# Patient Record
Sex: Female | Born: 1945 | Race: White | Hispanic: No | Smoking: Never smoker
Health system: Southern US, Community
[De-identification: ages and names within clinical notes are randomized; demographics above are authoritative.]

## PROBLEM LIST (undated history)

## (undated) DIAGNOSIS — I10 Essential (primary) hypertension: Secondary | ICD-10-CM

## (undated) DIAGNOSIS — C801 Malignant (primary) neoplasm, unspecified: Secondary | ICD-10-CM

## (undated) DIAGNOSIS — Z87448 Personal history of other diseases of urinary system: Secondary | ICD-10-CM

## (undated) HISTORY — PX: MASTECTOMY: SHX3

---

## 2017-03-17 ENCOUNTER — Emergency Department (HOSPITAL_BASED_OUTPATIENT_CLINIC_OR_DEPARTMENT_OTHER): Payer: Medicare Other

## 2017-03-17 ENCOUNTER — Encounter (HOSPITAL_BASED_OUTPATIENT_CLINIC_OR_DEPARTMENT_OTHER): Payer: Self-pay | Admitting: Emergency Medicine

## 2017-03-17 ENCOUNTER — Emergency Department (HOSPITAL_BASED_OUTPATIENT_CLINIC_OR_DEPARTMENT_OTHER)
Admission: EM | Admit: 2017-03-17 | Discharge: 2017-03-17 | Disposition: A | Discharge status: Home / Self Care | Payer: Medicare Other | Attending: Emergency Medicine | Admitting: Emergency Medicine

## 2017-03-17 DIAGNOSIS — M7989 Other specified soft tissue disorders: Secondary | ICD-10-CM

## 2017-03-17 DIAGNOSIS — Z853 Personal history of malignant neoplasm of breast: Secondary | ICD-10-CM | POA: Insufficient documentation

## 2017-03-17 DIAGNOSIS — R600 Localized edema: Secondary | ICD-10-CM | POA: Insufficient documentation

## 2017-03-17 DIAGNOSIS — I10 Essential (primary) hypertension: Secondary | ICD-10-CM | POA: Diagnosis not present

## 2017-03-17 DIAGNOSIS — Z79899 Other long term (current) drug therapy: Secondary | ICD-10-CM | POA: Diagnosis not present

## 2017-03-17 HISTORY — DX: Personal history of other diseases of urinary system: Z87.448

## 2017-03-17 HISTORY — DX: Essential (primary) hypertension: I10

## 2017-03-17 HISTORY — DX: Malignant (primary) neoplasm, unspecified: C80.1

## 2017-03-17 NOTE — ED Provider Notes (Signed)
[facility] DEPT [facility] Provider Note   CSN: [id number] Arrival date & time: 03/17/17  1908  By signing my name below, I, Jeanell Sparrow, attest that this documentation has been prepared under the direction and in the presence of Dorie Rank, MD. Electronically Signed: Jeanell Sparrow, Scribe. 03/17/2017. 9:20 PM.  History   Chief Complaint Chief Complaint  Patient presents with  . Hand Problem   The history is provided by the patient. No language interpreter was used.   HPI Comments: Vanessa Castro is a 71 y.o. female with a PMHx of cancer who presents to the Emergency Department complaining of constant moderate left hand swelling that started a few days ago. She had a mastectomy in January 2018. She suspects a possible spider bite but is uncertain of any initiating factors. No known injury. No modifying factors. Denies any left hand pain or other complaints at this time.    PCP: Reita Cliche, MD  Past Medical History:  Diagnosis Date  . Cancer ([place])   . H/O bladder problems   . Hypertension     There are no active problems to display for this patient.   Past Surgical History:  Procedure Laterality Date  . MASTECTOMY      left    OB History    No data available       Home Medications    Prior to Admission medications   Medication Sig Start Date End Date Taking? Authorizing Provider  lisinopril (PRINIVIL,ZESTRIL) 2.5 MG tablet Take 2.5 mg by mouth daily.   Yes Historical Provider, MD    Family History No family history on file.  Social History Social History  Substance Use Topics  . Smoking status: Not on file  . Smokeless tobacco: Not on file  . Alcohol use Not on file     Allergies   Patient has no known allergies.   Review of Systems Review of Systems  Constitutional: Negative for fever.  Respiratory: Negative for shortness of breath.   Musculoskeletal: Positive for joint swelling (Left hand). Negative for myalgias.     Physical  Exam Updated Vital Signs BP (!) 189/98 (BP Location: Right Arm)   Pulse 79   Temp 98.1 F (36.7 C) (Oral)   Resp 18   Wt 135 lb (61.2 kg)   SpO2 98%   Physical Exam  Constitutional: She appears well-developed and well-nourished. No distress.  HENT:  Head: Normocephalic and atraumatic.  Right Ear: External ear normal.  Left Ear: External ear normal.  Eyes: Conjunctivae are normal. Right eye exhibits no discharge. Left eye exhibits no discharge. No scleral icterus.  Neck: Neck supple. No tracheal deviation present.  Cardiovascular: Normal rate.   Pulmonary/Chest: Effort normal. No stridor. No respiratory distress.  Status post left mastectomy with temporary spacer in place. Mild edema noted to the breast tissue. No erythema   Abdominal: She exhibits no distension.  Musculoskeletal: She exhibits edema. She exhibits no tenderness.  Mild edema of the left hand, no appreciable edema of the left forearm or upper. No tenderness or joint swelling.   Neurological: She is alert. Cranial nerve deficit: no gross deficits.  Skin: Skin is warm and dry. No rash noted.  Psychiatric: She has a normal mood and affect.  Nursing note and vitals reviewed.    ED Treatments / Results  DIAGNOSTIC STUDIES: Oxygen Saturation is 98% on RA, normal by my interpretation.    COORDINATION OF CARE: 9:24 PM- Pt advised of plan for treatment and pt agrees.  Labs (all  labs ordered are listed, but only abnormal results are displayed) Labs Reviewed - No data to display  EKG  EKG Interpretation None       Radiology Dg Hand Complete Left  Result Date: 03/17/2017 CLINICAL DATA:  Left hand swelling, no known injury, initial encounter EXAM: LEFT HAND - COMPLETE 3+ VIEW COMPARISON:  None. FINDINGS: There is no evidence of fracture or dislocation. No significant soft tissue abnormality is seen. Degenerative changes of the first [facility] joint are noted. IMPRESSION: No acute abnormality noted. Electronically Signed    By: Inez Catalina M.D.   On: 03/17/2017 20:34    Procedures Procedures (including critical care time)  Medications Ordered in ED Medications - No data to display   Initial Impression / Assessment and Plan / ED Course  I have reviewed the triage vital signs and the nursing notes.  Pertinent labs & imaging results that were available during my care of the patient were reviewed by me and considered in my medical decision making (see chart for details).   patient has mild painless edema of her left upper extremity, primarily her hand. Patient did have a mastectomy on that side in January. I suspect she is having lymphedema as a complication of that procedure. I doubt DVT however considering this new complaint I will have her return tomorrow to have an ultrasound of her left upper extremity.  Unfortunately ultrasound is not available this evening.  Considering my lower index of suspicion, I do not think she requires anticoagulation at this time.  Final Clinical Impressions(s) / ED Diagnoses   Final diagnoses:  Left upper extremity swelling    I personally performed the services described in this documentation, which was scribed in my presence.  The recorded information has been reviewed and is accurate.     Dorie Rank, MD 03/17/17 [phone]

## 2017-03-17 NOTE — Discharge Instructions (Signed)
Follow up tomorrow to have the ultrasound of your arm done, return for chest pain, shortness of breath, fever , or other acute concerns

## 2017-03-17 NOTE — ED Notes (Signed)
Pt presents to ED with complaints of left hand swelling. No injury noted. No c/o pain. Pt sts she has a mastectomy in Jan this year with lymph node removal.

## 2017-03-18 ENCOUNTER — Ambulatory Visit (HOSPITAL_BASED_OUTPATIENT_CLINIC_OR_DEPARTMENT_OTHER)
Admission: RE | Admit: 2017-03-18 | Discharge: 2017-03-18 | Disposition: A | Discharge status: Home / Self Care | Payer: Medicare Other | Source: Ambulatory Visit | Attending: Emergency Medicine | Admitting: Emergency Medicine

## 2017-03-18 DIAGNOSIS — M7989 Other specified soft tissue disorders: Secondary | ICD-10-CM | POA: Insufficient documentation

## 2018-12-25 ENCOUNTER — Other Ambulatory Visit: Payer: Self-pay | Admitting: Obstetrics and Gynecology

## 2018-12-25 DIAGNOSIS — R928 Other abnormal and inconclusive findings on diagnostic imaging of breast: Secondary | ICD-10-CM

## 2018-12-25 DIAGNOSIS — R921 Mammographic calcification found on diagnostic imaging of breast: Secondary | ICD-10-CM

## 2018-12-31 ENCOUNTER — Ambulatory Visit
Admission: RE | Admit: 2018-12-31 | Discharge: 2018-12-31 | Disposition: A | Discharge status: Home / Self Care | Payer: Medicare Other | Source: Ambulatory Visit | Attending: Obstetrics and Gynecology | Admitting: Obstetrics and Gynecology

## 2018-12-31 DIAGNOSIS — R928 Other abnormal and inconclusive findings on diagnostic imaging of breast: Secondary | ICD-10-CM

## 2018-12-31 DIAGNOSIS — R921 Mammographic calcification found on diagnostic imaging of breast: Secondary | ICD-10-CM

## 2019-07-14 IMAGING — MG STEREOTACTIC VACUUM ASSIST RIGHT
8 of 12 series · 8 of 32 positions shown · non-contrast
Comparison: Previous exams.

Addendum:
CLINICAL DATA: Indeterminate right breast calcifications.

EXAM:
RIGHT BREAST STEREOTACTIC CORE NEEDLE BIOPSY

[R (1 of 6)]
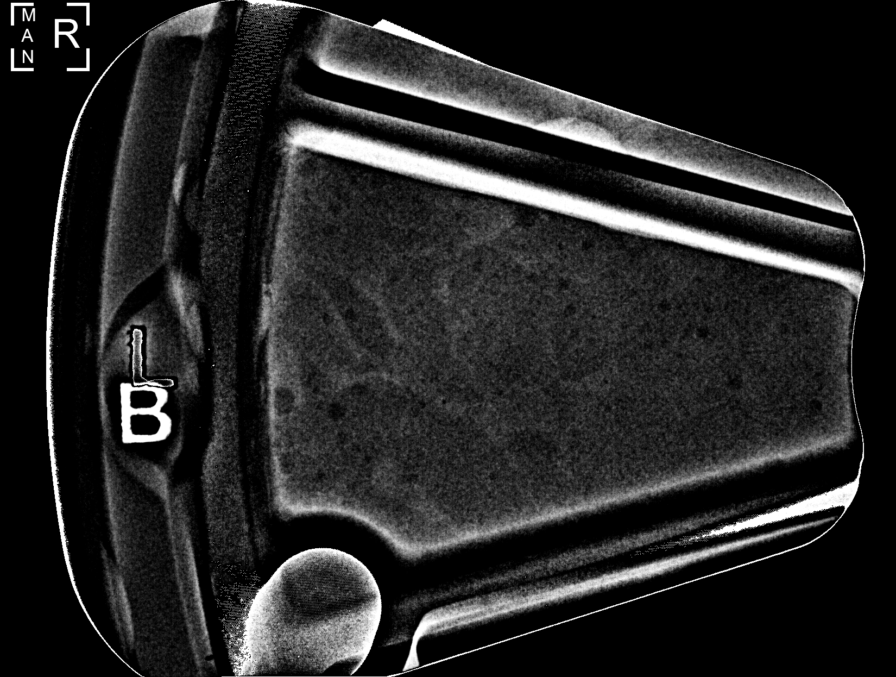

[R (2 of 6)]
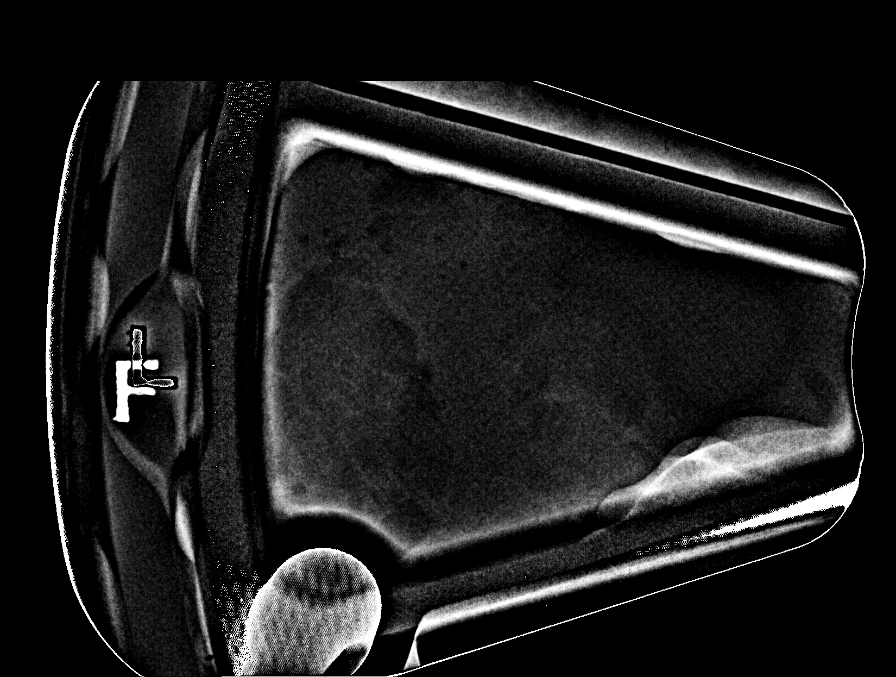

[R (3 of 6)]
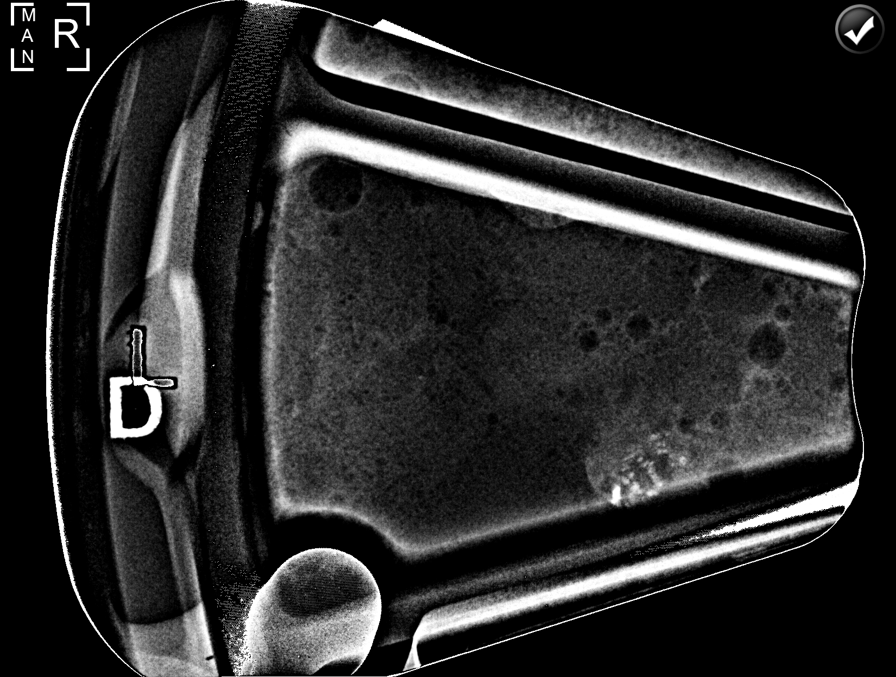

[R (4 of 6)]
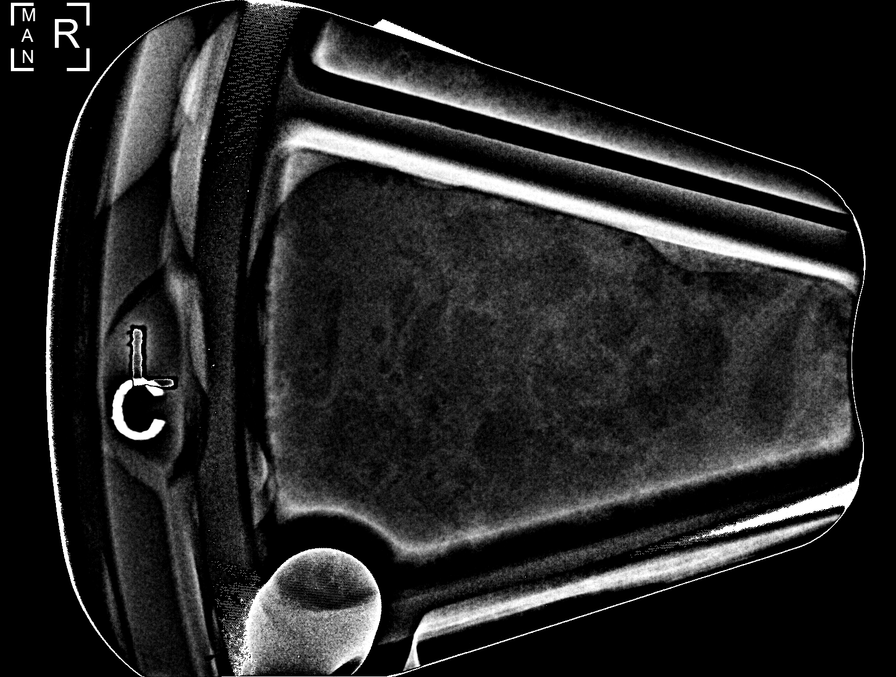

[R (5 of 6)]
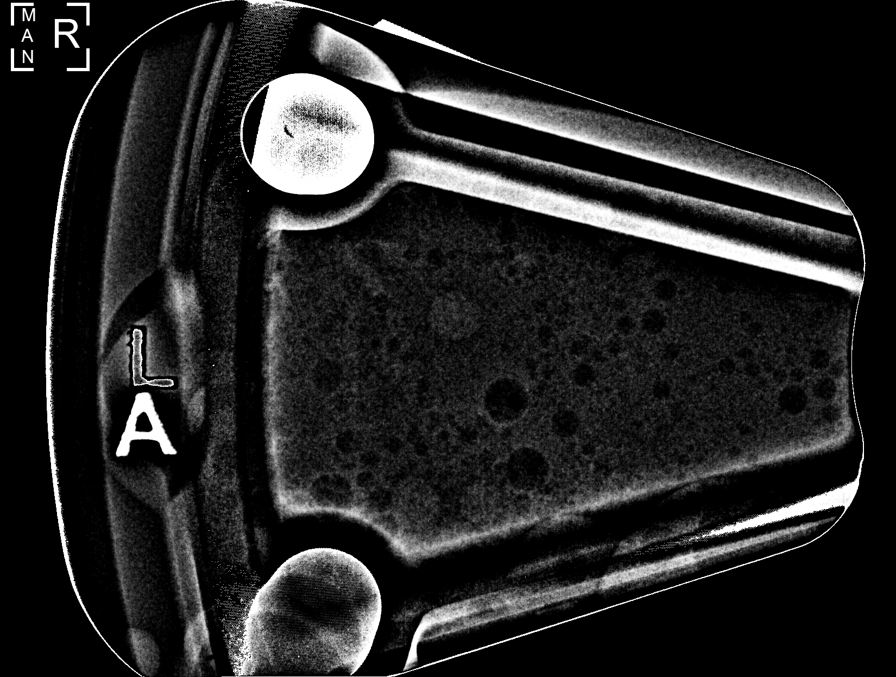

[R (6 of 6)]
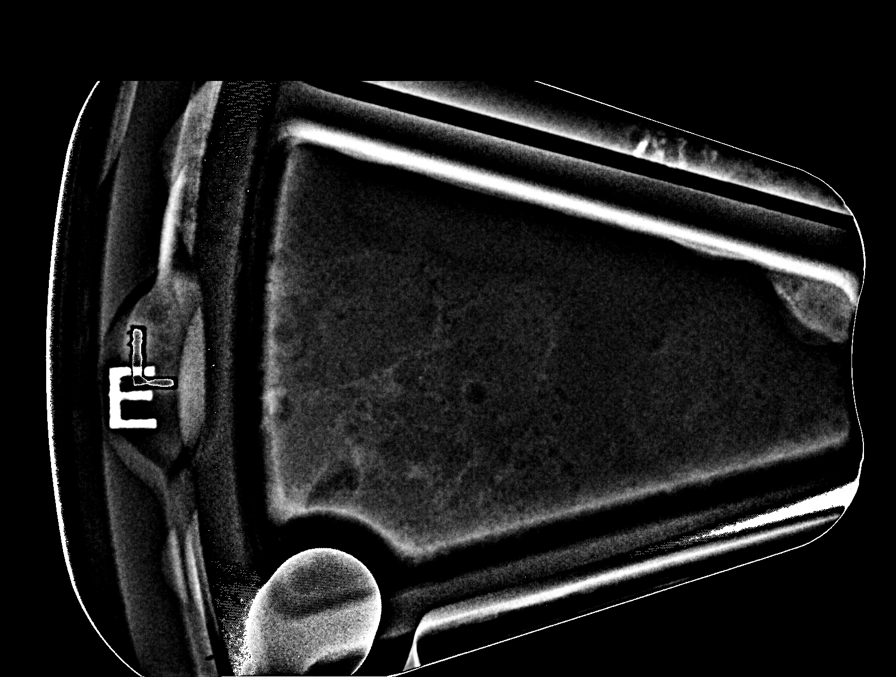

[R CC]
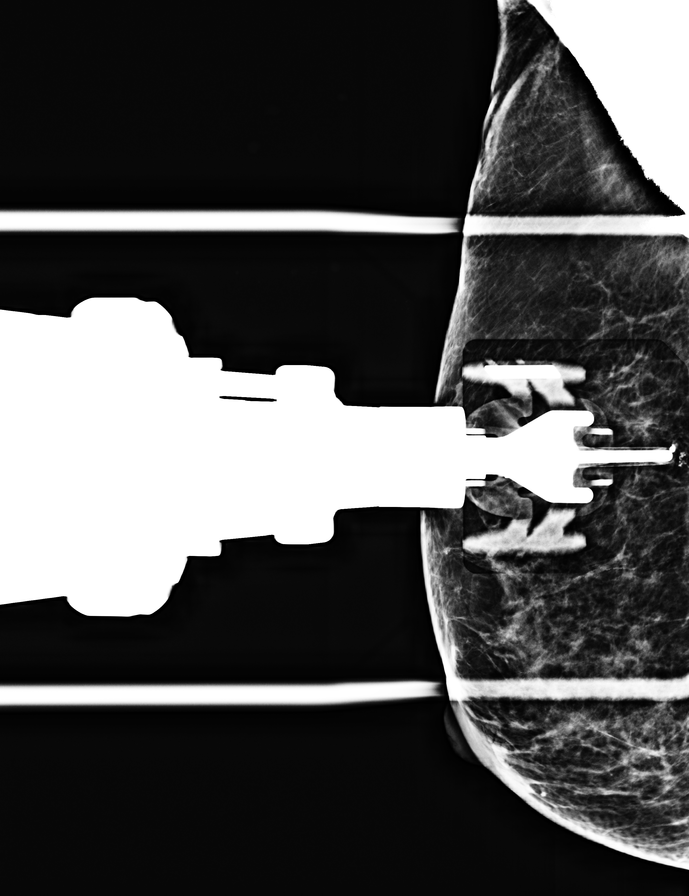

[R CC tomo · tomo slice 31/61.0]
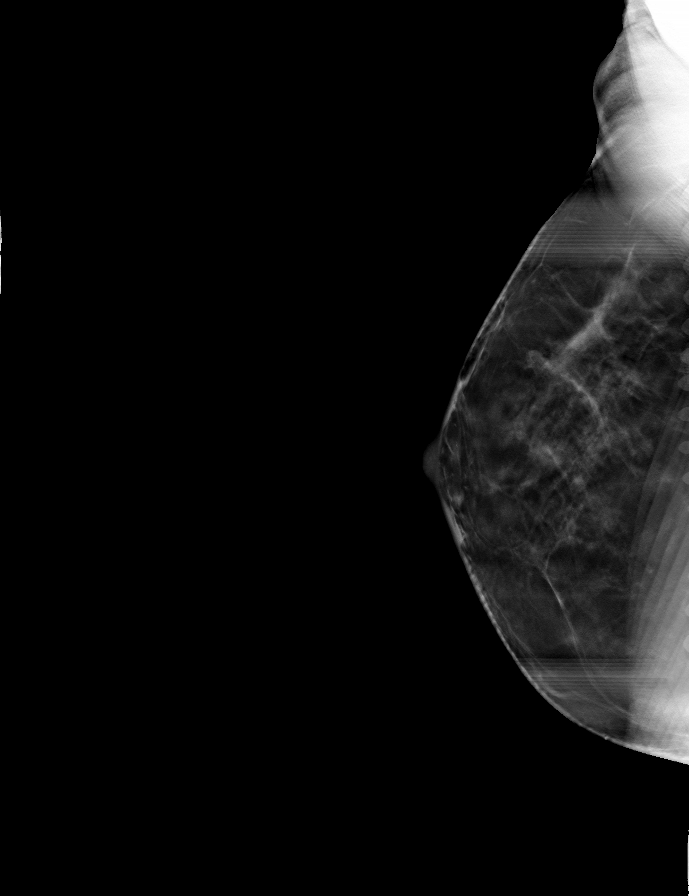

[8 of 32 positions shown; findings below may reference images not displayed]



Using sterile technique and 1% Lidocaine as local anesthetic, under
stereotactic guidance, a 9 gauge vacuum assisted device was used to
perform core needle biopsy of calcifications within the upper-outer
right breast using a cranial approach. Specimen radiograph was
performed showing calcifications. Specimens with calcifications are
identified for pathology.

Lesion quadrant: Upper outer quadrant

At the conclusion of the procedure, a coil shaped tissue marker clip
was deployed into the biopsy cavity. Follow-up 2-view mammogram was
performed and dictated separately.
IMPRESSION: Stereotactic-guided biopsy of right breast calcifications. No
apparent complications.

ADDENDUM:
Pathology revealed FAT NECROSIS WITH CALCIFICATIONS of the Right
breast, upper outer.. This was found to be concordant by Dr. Crisostomo
Ullrich.

Pathology results were to be discussed by telephone with the patient
by Iomeng Miumiu, RN at [facility] [[email or web address]]. The patient
was encouraged to call The [REDACTED] for
any additional concerns.

The patient was instructed to return for annual screening
mammography and informed a reminder notice would be sent regarding
this appointment.

Imaging and pathology reports were faxed to Iomeng Miumiu, RN at [facility] [[email or web address]] on January 01, 2019.

Pathology results reported by Hendrik Jandres, RN on 01/01/2019.

*** End of Addendum ***

## 2023-12-19 DIAGNOSIS — I10 Essential (primary) hypertension: Secondary | ICD-10-CM | POA: Diagnosis not present

## 2023-12-19 DIAGNOSIS — R748 Abnormal levels of other serum enzymes: Secondary | ICD-10-CM | POA: Diagnosis not present

## 2023-12-19 DIAGNOSIS — Z0000 Encounter for general adult medical examination without abnormal findings: Secondary | ICD-10-CM | POA: Diagnosis not present

## 2023-12-19 DIAGNOSIS — R636 Underweight: Secondary | ICD-10-CM | POA: Diagnosis not present

## 2023-12-26 DIAGNOSIS — K838 Other specified diseases of biliary tract: Secondary | ICD-10-CM | POA: Diagnosis not present

## 2023-12-26 DIAGNOSIS — K8020 Calculus of gallbladder without cholecystitis without obstruction: Secondary | ICD-10-CM | POA: Diagnosis not present

## 2023-12-26 DIAGNOSIS — R748 Abnormal levels of other serum enzymes: Secondary | ICD-10-CM | POA: Diagnosis not present

## 2023-12-26 DIAGNOSIS — R7989 Other specified abnormal findings of blood chemistry: Secondary | ICD-10-CM | POA: Diagnosis not present

## 2024-01-04 DIAGNOSIS — R0789 Other chest pain: Secondary | ICD-10-CM | POA: Diagnosis not present

## 2024-01-04 DIAGNOSIS — R0602 Shortness of breath: Secondary | ICD-10-CM | POA: Diagnosis not present

## 2024-01-04 DIAGNOSIS — Z853 Personal history of malignant neoplasm of breast: Secondary | ICD-10-CM | POA: Diagnosis not present

## 2024-01-04 DIAGNOSIS — Z79899 Other long term (current) drug therapy: Secondary | ICD-10-CM | POA: Diagnosis not present

## 2024-01-04 DIAGNOSIS — I10 Essential (primary) hypertension: Secondary | ICD-10-CM | POA: Diagnosis not present

## 2024-01-04 DIAGNOSIS — R079 Chest pain, unspecified: Secondary | ICD-10-CM | POA: Diagnosis not present

## 2024-01-04 DIAGNOSIS — U071 COVID-19: Secondary | ICD-10-CM | POA: Diagnosis not present

## 2024-01-17 DIAGNOSIS — Z1231 Encounter for screening mammogram for malignant neoplasm of breast: Secondary | ICD-10-CM | POA: Diagnosis not present

## 2024-02-12 DIAGNOSIS — R053 Chronic cough: Secondary | ICD-10-CM | POA: Diagnosis not present

## 2024-02-12 DIAGNOSIS — R413 Other amnesia: Secondary | ICD-10-CM | POA: Diagnosis not present

## 2024-02-21 DIAGNOSIS — R413 Other amnesia: Secondary | ICD-10-CM | POA: Diagnosis not present

## 2024-03-01 ENCOUNTER — Encounter (HOSPITAL_BASED_OUTPATIENT_CLINIC_OR_DEPARTMENT_OTHER): Payer: Self-pay | Admitting: Emergency Medicine

## 2024-03-01 ENCOUNTER — Other Ambulatory Visit: Payer: Self-pay

## 2024-03-01 ENCOUNTER — Emergency Department (HOSPITAL_BASED_OUTPATIENT_CLINIC_OR_DEPARTMENT_OTHER)

## 2024-03-01 ENCOUNTER — Emergency Department (HOSPITAL_BASED_OUTPATIENT_CLINIC_OR_DEPARTMENT_OTHER)
Admission: EM | Admit: 2024-03-01 | Discharge: 2024-03-01 | Disposition: A | Discharge status: Home / Self Care | Attending: Emergency Medicine | Admitting: Emergency Medicine

## 2024-03-01 DIAGNOSIS — I10 Essential (primary) hypertension: Secondary | ICD-10-CM | POA: Diagnosis not present

## 2024-03-01 DIAGNOSIS — R911 Solitary pulmonary nodule: Secondary | ICD-10-CM | POA: Diagnosis not present

## 2024-03-01 DIAGNOSIS — J40 Bronchitis, not specified as acute or chronic: Secondary | ICD-10-CM | POA: Insufficient documentation

## 2024-03-01 DIAGNOSIS — R059 Cough, unspecified: Secondary | ICD-10-CM | POA: Diagnosis not present

## 2024-03-01 DIAGNOSIS — Z79899 Other long term (current) drug therapy: Secondary | ICD-10-CM | POA: Diagnosis not present

## 2024-03-01 DIAGNOSIS — R0602 Shortness of breath: Secondary | ICD-10-CM | POA: Diagnosis not present

## 2024-03-01 LAB — CBC WITH DIFFERENTIAL/PLATELET
Abs Immature Granulocytes: 0.02 10*3/uL (ref 0.00–0.07)
Basophils Absolute: 0.1 10*3/uL (ref 0.0–0.1)
Basophils Relative: 1 %
Eosinophils Absolute: 0.4 10*3/uL (ref 0.0–0.5)
Eosinophils Relative: 6 %
HCT: 39.6 % (ref 36.0–46.0)
Hemoglobin: 13.2 g/dL (ref 12.0–15.0)
Immature Granulocytes: 0 %
Lymphocytes Relative: 18 %
Lymphs Abs: 1.1 10*3/uL (ref 0.7–4.0)
MCH: 30.6 pg (ref 26.0–34.0)
MCHC: 33.3 g/dL (ref 30.0–36.0)
MCV: 91.9 fL (ref 80.0–100.0)
Monocytes Absolute: 0.6 10*3/uL (ref 0.1–1.0)
Monocytes Relative: 9 %
Neutro Abs: 4.1 10*3/uL (ref 1.7–7.7)
Neutrophils Relative %: 66 %
Platelets: 293 10*3/uL (ref 150–400)
RBC: 4.31 MIL/uL (ref 3.87–5.11)
RDW: 12.7 % (ref 11.5–15.5)
WBC: 6.2 10*3/uL (ref 4.0–10.5)
nRBC: 0 % (ref 0.0–0.2)

## 2024-03-01 LAB — BASIC METABOLIC PANEL WITH GFR
Anion gap: 8 (ref 5–15)
BUN: 17 mg/dL (ref 8–23)
CO2: 27 mmol/L (ref 22–32)
Calcium: 9.2 mg/dL (ref 8.9–10.3)
Chloride: 103 mmol/L (ref 98–111)
Creatinine, Ser: 0.55 mg/dL (ref 0.44–1.00)
GFR, Estimated: >60 mL/min (ref >60)
Glucose, Bld: 138 mg/dL — ABNORMAL HIGH (ref 70–99)
Potassium: 4 mmol/L (ref 3.5–5.1)
Sodium: 138 mmol/L (ref 135–145)

## 2024-03-01 LAB — BRAIN NATRIURETIC PEPTIDE: B Natriuretic Peptide: 20 pg/mL (ref 0.0–100.0)

## 2024-03-01 LAB — TROPONIN I (HIGH SENSITIVITY)
Troponin I (High Sensitivity): 9 ng/L (ref <18)
Troponin I (High Sensitivity): 12 ng/L (ref <18)

## 2024-03-01 MED ORDER — IOHEXOL 350 MG/ML SOLN
75.0000 mL | Freq: Once | INTRAVENOUS | Status: AC | PRN
  Administered 2024-03-01: 75 mL via INTRAVENOUS

## 2024-03-01 MED ORDER — ALBUTEROL SULFATE HFA 108 (90 BASE) MCG/ACT IN AERS: 2.0000 | INHALATION_SPRAY | RESPIRATORY_TRACT | Status: DC | PRN

## 2024-03-01 MED ORDER — ALBUTEROL SULFATE HFA 108 (90 BASE) MCG/ACT IN AERS
2.0000 | INHALATION_SPRAY | RESPIRATORY_TRACT | Status: AC
  Administered 2024-03-01: 2 via RESPIRATORY_TRACT
  Filled 2024-03-01: qty 6.7

## 2024-03-01 NOTE — ED Triage Notes (Signed)
 Pt to ED from home c/o wheezing and SOB that started today gradually getting worse.  Has had a productive clear/green cough intermittently since last year after being dx with pneumonia.  Denies pain.  Hx of HTN and did not take losartan today.

## 2024-03-01 NOTE — ED Provider Notes (Signed)
 [place] [facility] Provider Note   CSN: [id number] Arrival date & time: 03/01/24  1952     History  Chief Complaint  Patient presents with   Wheezing   Shortness of Breath    Vanessa Castro is a 78 y.o. female.  Patient is a 78 year old female with a history of hypertension who presents with shortness of breath.  She says that she had pneumonia in the fall and since that time has had ongoing cough and intermittent shortness of breath.  Her cough is productive of mostly clear sputum.  Today her breathing got worse than it has been and she decided to come in.  She denies any associated chest pain.  No wheezing.  No productive cough.  No leg swelling.  No known fevers.  No vomiting or diarrhea.  She seen her primary care doctor for the ongoing cough but has not seen a pulmonologist.       Home Medications Prior to Admission medications   Medication Sig Start Date End Date Taking? Authorizing Provider  lisinopril (PRINIVIL,ZESTRIL) 2.5 MG tablet Take 2.5 mg by mouth daily.    [provider]      Allergies    Patient has no known allergies.    Review of Systems   Review of Systems  Constitutional:  Negative for chills, diaphoresis, fatigue and fever.  HENT:  Negative for congestion, rhinorrhea and sneezing.   Eyes: Negative.   Respiratory:  Positive for cough and shortness of breath. Negative for chest tightness.   Cardiovascular:  Negative for chest pain and leg swelling.  Gastrointestinal:  Negative for abdominal pain, blood in stool, diarrhea, nausea and vomiting.  Genitourinary:  Negative for difficulty urinating, flank pain, frequency and hematuria.  Musculoskeletal:  Negative for arthralgias and back pain.  Skin:  Negative for rash.  Neurological:  Negative for dizziness, speech difficulty, weakness, numbness and headaches.    Physical Exam Updated Vital Signs BP (!) 148/71   Pulse 80   Temp 98.1 F (36.7 C)    Resp 18   Ht 5\' 4"  (1.626 m)   Wt 56.2 kg   SpO2 98%   BMI 21.28 kg/m  Physical Exam Constitutional:      Appearance: She is well-developed.  HENT:     Head: Normocephalic and atraumatic.  Eyes:     Pupils: Pupils are equal, round, and reactive to light.  Cardiovascular:     Rate and Rhythm: Normal rate and regular rhythm.     Heart sounds: Normal heart sounds.  Pulmonary:     Effort: Pulmonary effort is normal. No respiratory distress.     Breath sounds: Normal breath sounds. No wheezing or rales.  Chest:     Chest wall: No tenderness.  Abdominal:     General: Bowel sounds are normal.     Palpations: Abdomen is soft.     Tenderness: There is no abdominal tenderness. There is no guarding or rebound.  Musculoskeletal:        General: Normal range of motion.     Cervical back: Normal range of motion and neck supple.     Comments: No edema or calf tenderness  Lymphadenopathy:     Cervical: No cervical adenopathy.  Skin:    General: Skin is warm and dry.     Findings: No rash.  Neurological:     Mental Status: She is alert and oriented to person, place, and time.     ED Results /  Procedures / Treatments   Labs (all labs ordered are listed, but only abnormal results are displayed) Labs Reviewed  BASIC METABOLIC PANEL WITH GFR - Abnormal; Notable for the following components:      Result Value   Glucose, Bld 138 (*)    All other components within normal limits  BRAIN NATRIURETIC PEPTIDE  CBC WITH DIFFERENTIAL/PLATELET  TROPONIN I (HIGH SENSITIVITY)  TROPONIN I (HIGH SENSITIVITY)    EKG None  Radiology CT Angio Chest PE W/Cm &/Or Wo Cm Result Date: 03/01/2024 CLINICAL DATA:  Wheezing and shortness of breath. Productive clear/green cough EXAM: CT ANGIOGRAPHY CHEST WITH CONTRAST TECHNIQUE: Multidetector CT imaging of the chest was performed using the standard protocol during bolus administration of intravenous contrast. Multiplanar CT image reconstructions and MIPs  were obtained to evaluate the vascular anatomy. RADIATION DOSE REDUCTION: This exam was performed according to the departmental dose-optimization program which includes automated exposure control, adjustment of the mA and/or kV according to patient size and/or use of iterative reconstruction technique. CONTRAST:  75mL OMNIPAQUE IOHEXOL 350 MG/ML SOLN COMPARISON:  Radiograph earlier today FINDINGS: Cardiovascular: Negative for acute pulmonary embolism. Normal heart size. No pericardial effusion. No aortic aneurysm. Aortic atherosclerotic calcification. Mediastinum/Nodes: Small of debris in the posterior trachea. Esophagus is unremarkable. No thoracic adenopathy. Lungs/Pleura: Mild bronchial wall thickening and scattered mucous plugs in the lower lobes. No focal consolidation, pleural effusion, or pneumothorax. 3 mm pulmonary nodule in the right lower lobe (series 303/image 52). Upper Abdomen: No acute abnormality. Musculoskeletal: No acute fracture. Postoperative changes left breast. Surgical clips left axilla. Review of the MIP images confirms the above findings. IMPRESSION: 1. Negative for acute pulmonary embolism. 2. Mild bronchitis and scattered mucous plugs in the lower lobes. 3. Aortic Atherosclerosis (ICD10-I70.0). 4. 3 mm pulmonary nodule in the right lower lobe. This is favored infectious/inflammatory however given history of cancer, continued attention on follow-up is recommended. Electronically Signed   By: Minerva Fester M.D.   On: 03/01/2024 22:51   DG Chest 2 View Result Date: 03/01/2024 CLINICAL DATA:  Shortness of breath EXAM: CHEST - 2 VIEW COMPARISON:  None available FINDINGS: Heart and mediastinal contours are within normal limits. No focal opacities or effusions. No acute bony abnormality. IMPRESSION: No active cardiopulmonary disease. Electronically Signed   By: Charlett Nose M.D.   On: 03/01/2024 20:40    Procedures Procedures    Medications Ordered in ED Medications  albuterol  (VENTOLIN HFA) 108 (90 Base) MCG/ACT inhaler 2 puff (has no administration in time range)  albuterol (VENTOLIN HFA) 108 (90 Base) MCG/ACT inhaler 2 puff (2 puffs Inhalation Given 03/01/24 2009)  iohexol (OMNIPAQUE) 350 MG/ML injection 75 mL (75 mLs Intravenous Contrast Given 03/01/24 2244)    ED Course/ Medical Decision Making/ A&P                                 Medical Decision Making Amount and/or Complexity of Data Reviewed Labs: ordered. Radiology: ordered.  Risk Prescription drug management.   Patient is a 78 year old who presents with shortness of breath.  She reportedly had some wheezing on arrival and was given 2 puffs on inhaler.  She says she felt much better after that.  Her lungs sounded fairly clear on my exam.  She has no hypoxia or increased work of breathing.  Chest x-ray was interpreted by me and confirmed by the radiologist to show no evidence of pneumonia.  No pulmonary edema.  Her BNP is normal.  Troponin is negative.  No ischemic changes on EKG.  No other symptoms that would be more concerning for ACS.  She has CT scan of her chest which shows no evidence of PE.  There is some evidence of bronchitis and mucous plugging.  Is also a pulmonary nodule.  She does not have fever or other concerns for bacterial infection.  She was discharged home in good condition.  Was dispensed her inhaler to use 2 puffs every 4 6 hours for the next few days and then as needed.  Was encouraged to follow-up with her PCP.  Discussed the pulmonary nodule and that her PCP can monitor this.  Return precautions were given.  Final Clinical Impression(s) / ED Diagnoses Final diagnoses:  Bronchitis  Pulmonary nodule    Rx / DC Orders ED Discharge Orders     None         Rolan Bucco, MD 03/01/24 2337

## 2024-03-01 NOTE — Discharge Instructions (Addendum)
 Use the inhaler every 4-6 hours for the next few days and then just as needed after that.  You have a small pulmonary nodule.  This can be monitored by your primary care doctor.  Return to the emergency room if you have any worsening symptoms.

## 2024-03-04 DIAGNOSIS — H52203 Unspecified astigmatism, bilateral: Secondary | ICD-10-CM | POA: Diagnosis not present

## 2024-03-04 DIAGNOSIS — H2513 Age-related nuclear cataract, bilateral: Secondary | ICD-10-CM | POA: Diagnosis not present

## 2024-03-04 DIAGNOSIS — H5203 Hypermetropia, bilateral: Secondary | ICD-10-CM | POA: Diagnosis not present

## 2024-03-04 DIAGNOSIS — H43393 Other vitreous opacities, bilateral: Secondary | ICD-10-CM | POA: Diagnosis not present

## 2024-03-04 DIAGNOSIS — H02831 Dermatochalasis of right upper eyelid: Secondary | ICD-10-CM | POA: Diagnosis not present

## 2024-03-04 DIAGNOSIS — H25012 Cortical age-related cataract, left eye: Secondary | ICD-10-CM | POA: Diagnosis not present

## 2024-03-04 DIAGNOSIS — D3132 Benign neoplasm of left choroid: Secondary | ICD-10-CM | POA: Diagnosis not present

## 2024-03-04 DIAGNOSIS — H524 Presbyopia: Secondary | ICD-10-CM | POA: Diagnosis not present

## 2024-03-04 DIAGNOSIS — H02834 Dermatochalasis of left upper eyelid: Secondary | ICD-10-CM | POA: Diagnosis not present

## 2024-03-04 DIAGNOSIS — H40003 Preglaucoma, unspecified, bilateral: Secondary | ICD-10-CM | POA: Diagnosis not present

## 2024-03-07 DIAGNOSIS — J410 Simple chronic bronchitis: Secondary | ICD-10-CM | POA: Diagnosis not present

## 2024-03-07 DIAGNOSIS — R911 Solitary pulmonary nodule: Secondary | ICD-10-CM | POA: Diagnosis not present

## 2024-03-11 DIAGNOSIS — R413 Other amnesia: Secondary | ICD-10-CM | POA: Diagnosis not present

## 2024-03-11 DIAGNOSIS — G319 Degenerative disease of nervous system, unspecified: Secondary | ICD-10-CM | POA: Diagnosis not present

## 2024-03-11 DIAGNOSIS — I6782 Cerebral ischemia: Secondary | ICD-10-CM | POA: Diagnosis not present

## 2024-03-11 DIAGNOSIS — G3189 Other specified degenerative diseases of nervous system: Secondary | ICD-10-CM | POA: Diagnosis not present

## 2024-03-24 DIAGNOSIS — E854 Organ-limited amyloidosis: Secondary | ICD-10-CM | POA: Diagnosis not present

## 2024-03-24 DIAGNOSIS — I680 Cerebral amyloid angiopathy: Secondary | ICD-10-CM | POA: Diagnosis not present

## 2024-04-15 DIAGNOSIS — R911 Solitary pulmonary nodule: Secondary | ICD-10-CM | POA: Diagnosis not present

## 2024-04-15 DIAGNOSIS — J410 Simple chronic bronchitis: Secondary | ICD-10-CM | POA: Diagnosis not present

## 2024-05-06 DIAGNOSIS — E854 Organ-limited amyloidosis: Secondary | ICD-10-CM | POA: Diagnosis not present

## 2024-05-06 DIAGNOSIS — I680 Cerebral amyloid angiopathy: Secondary | ICD-10-CM | POA: Diagnosis not present

## 2024-05-06 DIAGNOSIS — R413 Other amnesia: Secondary | ICD-10-CM | POA: Diagnosis not present

## 2024-05-06 DIAGNOSIS — G309 Alzheimer's disease, unspecified: Secondary | ICD-10-CM | POA: Diagnosis not present

## 2024-06-24 DIAGNOSIS — G301 Alzheimer's disease with late onset: Secondary | ICD-10-CM | POA: Diagnosis not present
